# Patient Record
Sex: Male | Born: 1960 | Race: White | Hispanic: No | Marital: Single
Health system: Southern US, Community
[De-identification: ages and names within clinical notes are randomized; demographics above are authoritative.]

---

## 2009-02-21 ENCOUNTER — Emergency Department (HOSPITAL_BASED_OUTPATIENT_CLINIC_OR_DEPARTMENT_OTHER): Admission: EM | Admit: 2009-02-21 | Discharge: 2009-02-21 | Payer: Self-pay | Admitting: Emergency Medicine

## 2009-02-21 ENCOUNTER — Ambulatory Visit: Payer: Self-pay | Admitting: Diagnostic Radiology

## 2009-03-19 ENCOUNTER — Ambulatory Visit (HOSPITAL_COMMUNITY): Admission: RE | Admit: 2009-03-19 | Discharge: 2009-03-20 | Payer: Self-pay | Admitting: Orthopaedic Surgery

## 2009-04-07 ENCOUNTER — Ambulatory Visit (HOSPITAL_COMMUNITY): Admission: RE | Admit: 2009-04-07 | Discharge: 2009-04-08 | Payer: Self-pay | Admitting: Orthopaedic Surgery

## 2009-05-22 ENCOUNTER — Ambulatory Visit (HOSPITAL_COMMUNITY): Admission: RE | Admit: 2009-05-22 | Discharge: 2009-05-22 | Payer: Self-pay | Admitting: Orthopaedic Surgery

## 2010-11-26 LAB — CBC
HCT: 38.1 % — ABNORMAL LOW (ref 39.0–52.0)
Hemoglobin: 13.2 g/dL (ref 13.0–17.0)
MCHC: 34.6 g/dL (ref 30.0–36.0)
MCV: 88 fL (ref 78.0–100.0)
Platelets: 146 10*3/uL — ABNORMAL LOW (ref 150–400)
RDW: 12.8 % (ref 11.5–15.5)

## 2010-11-26 LAB — DIFFERENTIAL
Basophils Absolute: 0 10*3/uL (ref 0.0–0.1)
Eosinophils Relative: 3 % (ref 0–5)
Monocytes Relative: 7 % (ref 3–12)
Neutro Abs: 3.5 10*3/uL (ref 1.7–7.7)
Neutrophils Relative %: 67 % (ref 43–77)

## 2010-11-26 LAB — SEDIMENTATION RATE: Sed Rate: 22 mm/hr — ABNORMAL HIGH (ref 0–16)

## 2010-11-27 LAB — CBC
HCT: 38.8 % — ABNORMAL LOW (ref 39.0–52.0)
Hemoglobin: 13.4 g/dL (ref 13.0–17.0)
MCV: 90.5 fL (ref 78.0–100.0)

## 2010-11-27 LAB — ANAEROBIC CULTURE

## 2010-11-27 LAB — WOUND CULTURE

## 2010-11-28 LAB — CBC
Hemoglobin: 14.1 g/dL (ref 13.0–17.0)
MCV: 91.1 fL (ref 78.0–100.0)
RBC: 4.45 MIL/uL (ref 4.22–5.81)
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
## 2011-01-04 NOTE — Op Note (Signed)
NAMEMERLEN, GURRY              ACCOUNT NO.:  [id number]   MEDICAL RECORD NO.:  [id number]          PATIENT TYPE:  OIB   LOCATION:  5007                         FACILITY:  [facility]   PHYSICIAN:  Jack Mckinney, M.D.DATE OF BIRTH:  Jan 21, 1961   DATE OF PROCEDURE:  04/07/2009  DATE OF DISCHARGE:                               OPERATIVE REPORT   PREOPERATIVE DIAGNOSES:  Left clavicle wound dehiscence and infection,  status post open reduction and internal fixation of left clavicle.   POSTOPERATIVE DIAGNOSES:  Left clavicle wound dehiscence and infection,  status post open reduction and internal fixation of left clavicle.   PROCEDURE:  Irrigation and debridement of left clavicle superficial and  deep tissues including hardware.   SURGEON:  Jack Panda. Magnus Ivan, MD   ANESTHESIA:  General.   BLOOD LOSS:  150 mL.   ANTIBIOTICS:  600 mg of IV clindamycin after cultures obtained.   FINDINGS:  Likely suture abscess, left clavicle wound, gross purulence  encountered, cultures Gram stain pending.   COMPLICATIONS:  None.   INDICATIONS:  Briefly, Jack Mckinney is a 50 year old, who 2-1/2 weeks ago  underwent left clavicle open reduction and internal fixation.  This was  a month after his original injury.  He had sustained abrasions around  his shoulder girdle, and we elected to hold on surgery until the soft  tissues calm down.  I did take him to the OR.  His soft tissues healed  nicely.  He still had compromise of the skin with significantly  displaced clavicle fracture and shortening as well.  He is a heavy  manual labor, and we recommended open reduction and internal fixation  due to the soft tissue compromise on the skin as well as the nature of  the injury.  He underwent successful plating of the clavicle and I have  seen him back in the office.  When he did come to the office yesterday,  I noted that he had several areas of breakdown of the wound itself and I  could press  gross purulence from this area.  He does not have any fevers  or chills, and said he actually felt well, and he was using his shoulder  more and he said it did not hurt.  Given the nature of this and presence  of his wound, I felt this was potential for postop infection that could  even be deep striking down the plate, and I recommended immediate  irrigation and debridement.  Of note, we did close his original incision  with a running subcuticular Vicryl suture, and there were several areas  where the suture was exposed.   DESCRIPTION OF PROCEDURE:  After informed consent was obtained,  appropriate left shoulder was marked.  He was brought to the operating  room and placed supine on the operative table.  General anesthesia was  then obtained.  His left clavicle area was then prepped and draped with  Betadine scrub and paint and sterile drapes.  Time-out was called to  identify the correct patient and correct left shoulder.  I then opened  up the incision in its  entirety and found to have a gross purulence  along the incision itself.  There was one area medially where it seemed  to get into the deeper tissues near the plate.  I removed all the Vicryl  sutures from the shoulder wound, and then copiously irrigated the soft  tissue superficially and deep with 6 L of pulsatile lavage solution.  I  removed the necrotic tissue with a rongeur.  I then assessed the plate  and the clavicle in general and I felt that the fixation was stable.  I  did make a decision to leave the plate intact because I did not see any  further gross purulence coming from anywhere deep at all to suggest this  was a deep infection.  I then ran an additional 1 L of normal saline  solution through the clavicle wound with pulsatile lavage.  I then  closed the deep tissue over the plate with interrupted 0 PDS suture  followed by 2-0 PDS suture in the subcutaneous tissue, and I used  interrupted 2-0 nylon on the skin.   Xeroform followed by well-padded  sterile dressing was applied.  The patient was awakened, extubated, and  taken to recovery room in stable condition.  Postoperatively, admitted  for observation, IV antibiotics with transition to oral antibiotics with  close followup in the office.      Jack Mckinney, M.D.  Electronically Signed     CYB/MEDQ  D:  04/07/2009  T:  04/08/2009  Job:  [id number]
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
## 2019-12-05 ENCOUNTER — Ambulatory Visit: Payer: Self-pay | Attending: Internal Medicine

## 2019-12-05 DIAGNOSIS — Z23 Encounter for immunization: Secondary | ICD-10-CM

## 2019-12-05 NOTE — Progress Notes (Signed)
   Covid-19 Vaccination Clinic  Name:  Clair Alfieri    MRN: [id number] DOB: 30-Dec-1960  12/05/2019  Mr. Lowrey was observed post Covid-19 immunization for 15 minutes without incident. He was provided with Vaccine Information Sheet and instruction to access the V-Safe system.   Mr. Juarbe was instructed to call 911 with any severe reactions post vaccine: Marland Kitchen Difficulty breathing  . Swelling of face and throat  . A fast heartbeat  . A bad rash all over body  . Dizziness and weakness   Immunizations Administered    Name Date Dose VIS Date Route   Pfizer COVID-19 Vaccine 12/05/2019  8:58 AM 0.3 mL 08/02/2019 Intramuscular   Manufacturer: [employer], [employer]   Lot: [id number]   NDC: [address]-0

## 2019-12-30 ENCOUNTER — Ambulatory Visit: Payer: Self-pay | Attending: Internal Medicine

## 2019-12-30 DIAGNOSIS — Z23 Encounter for immunization: Secondary | ICD-10-CM

## 2019-12-30 NOTE — Progress Notes (Signed)
   Covid-19 Vaccination Clinic  Name:  Shelia Magallon    MRN: [id number] DOB: Nov 13, 1960  12/30/2019  Mr. Wix was observed post Covid-19 immunization for 15 minutes without incident. He was provided with Vaccine Information Sheet and instruction to access the V-Safe system.   Mr. Reaume was instructed to call 911 with any severe reactions post vaccine: Marland Kitchen Difficulty breathing  . Swelling of face and throat  . A fast heartbeat  . A bad rash all over body  . Dizziness and weakness   Immunizations Administered    Name Date Dose VIS Date Route   Pfizer COVID-19 Vaccine 12/30/2019  8:26 AM 0.3 mL 10/16/2018 Intramuscular   Manufacturer: [employer], [employer]   Lot: [id number]   NDC: [address]-8
